# Patient Record
Sex: Female | Born: 1965 | Race: White | Hispanic: No | Marital: Married | Smoking: Never smoker
Health system: Southern US, Community
[De-identification: ages and names within clinical notes are randomized; demographics above are authoritative.]

## PROBLEM LIST (undated history)

## (undated) DIAGNOSIS — E282 Polycystic ovarian syndrome: Secondary | ICD-10-CM

## (undated) HISTORY — PX: REDUCTION MAMMAPLASTY: SUR839

## (undated) HISTORY — DX: Polycystic ovarian syndrome: E28.2

## (undated) HISTORY — PX: APPENDECTOMY: SHX54

---

## 2006-10-26 ENCOUNTER — Ambulatory Visit (HOSPITAL_COMMUNITY): Admission: RE | Admit: 2006-10-26 | Discharge: 2006-10-26 | Payer: Self-pay | Admitting: Internal Medicine

## 2006-10-26 ENCOUNTER — Ambulatory Visit: Payer: Self-pay | Admitting: Internal Medicine

## 2009-12-10 ENCOUNTER — Ambulatory Visit (HOSPITAL_COMMUNITY): Admission: RE | Admit: 2009-12-10 | Discharge: 2009-12-10 | Payer: Self-pay | Admitting: Unknown Physician Specialty

## 2009-12-21 ENCOUNTER — Ambulatory Visit (HOSPITAL_COMMUNITY): Admission: RE | Admit: 2009-12-21 | Discharge: 2009-12-21 | Payer: Self-pay | Admitting: Specialist

## 2010-07-08 ENCOUNTER — Ambulatory Visit (HOSPITAL_COMMUNITY)
Admission: RE | Admit: 2010-07-08 | Discharge: 2010-07-08 | Payer: Self-pay | Source: Home / Self Care | Admitting: Otolaryngology

## 2010-08-01 ENCOUNTER — Ambulatory Visit (HOSPITAL_COMMUNITY)
Admission: RE | Admit: 2010-08-01 | Discharge: 2010-08-01 | Payer: Self-pay | Source: Home / Self Care | Attending: Otolaryngology | Admitting: Otolaryngology

## 2010-09-20 NOTE — Op Note (Signed)
NAME:  Sharon Kemp, Sharon Kemp                    ACCOUNT NO.:  [id number]  MEDICAL RECORD NO.:  [id number]          PATIENT TYPE:  AMB  LOCATION:  SDS                          FACILITY:  [facility]  PHYSICIAN:  David L. Annalee Genta, M.D.DATE OF BIRTH:  1965/10/22  DATE OF PROCEDURE:  08/01/2010 DATE OF DISCHARGE:  08/01/2010                              OPERATIVE REPORT   PREOPERATIVE DIAGNOSES: 1. Left nasal polyp. 2. Chronic nasal obstruction. 3. Inferior turbinate hypertrophy. 4. Status post prior septorhinoplasty.  POSTOPERATIVE DIAGNOSES: 1. Left nasal polyp. 2. Chronic nasal obstruction. 3. Inferior turbinate hypertrophy. 4. Status post prior septorhinoplasty.  INDICATIONS FOR SURGERY: 1. Left nasal polyp. 2. Chronic nasal obstruction. 3. Inferior turbinate hypertrophy. 4. Status post prior septorhinoplasty.  SURGICAL PROCEDURES: 1. Endoscopic nasal polypectomy. 2. Left sphenoidotomy with removal of tissue. 3. Bilateral inferior turbinate reduction.  SURGEON:  Kinnie Scales. Annalee Genta, MD  COMPLICATIONS:  None.  ESTIMATED BLOOD LOSS:  Approximately 50 mL.  The patient transferred from the operating room to the recovery room in stable condition.  BRIEF HISTORY:  The patient is a 45 year old white female who was referred to our office with a history of progressive symptoms of nasal airway obstruction and left-sided sinus pressure.  She had undergone previous CT scan, which showed a soft tissue mass filling the entire left posterior nasal passageway.  Findings consistent with intranasal polyp.  No significant sinus disease or infection was noted on the scan. The patient had undergone prior septorhinoplasty and also complained of bilateral nasal congestion and frequent nasal airway obstruction and it has been not gradually progressive and not responded to topical and oral steroids or antibiotics.  Given her history, examination, and physical findings, I recommended that we undertake the  above surgical procedures. The risks, benefits, and possible complications were discussed in detail with the patient and her husband, and they understood and concurred plan for surgery, which is scheduled as an outpatient under general anesthesia at [facility] OR.  PROCEDURE:  The patient was brought to the operating room on August 01, 2010, and placed in a supine position on the operating table. General endotracheal anesthesia was established without difficulty. When the patient was adequately anesthetized, she was positioned on the operating table and prepped and draped in a sterile fashion.  She was then injected with a total of 5 mL of 1% lidocaine, 1:100,000 solution epinephrine was injected in a submucosal fashion along the left lateral nasal wall, inferior turbinates, and via transoral sphenopalatine block. The patient's nose then packed with Afrin-soaked cottonoid pledgets left in place for approximately 10 minutes vasoconstriction hemostasis.  The stealth computer-assisted navigation headgear was then applied and the stealth device was calibrated, anatomic and surgical landmarks were identified and confirmed, and the stealth device was used throughout the endoscopic portion of the surgical procedure.  With the patient prepared for surgery, a 0-degree endoscopy was performed bilaterally.  On the patient's left-hand side, there was a large soft-tissue polyp filling the entire posterior aspect of the left nasal cavity and nasopharynx.  Right side was clear of any polypoid disease, infection, or significant  abnormality.  Using a straight microdebrider under direct visualization, the polypoid material was completely resected, the attachment was along the left posterior nasal septum at its junction with the sphenoid rostrum.  The polypoid mass was occluding the entire left sphenoid sinus outflow tract.  Polypoid disease was resected to the level of its mucosal  attachment and resected in its entirety.  The tissue was then sent to pathology for gross microscopic evaluation.  The left sphenoid ostium was obstructed by polypoid disease.  The natural ostium was identified using the stealth navigation tool and then the soft tissue obstruction was cleared using a straight microdebrider.  Soft tissue was completely resected from the sinus ostium and small amount of polypoid material within the sinus itself, otherwise sinus was clear of any infection or polyp.  The natural ostium was enlarged in a medial and inferior direction to create a more patent left sphenoid sinus ostium.  Bovie suction cautery set at 15 watts was used to cauterize the mucosal attachment of the polypoid of the resected polyp to reduce the risk of any significant bleeding. Remainder of nasal cavity and sinus passageways were normal and patent.  Bilateral inferior turbinate reduction was then performed with cautery set at 12 watts.  Two submucosal passes were made in each inferior turbinate.  Small turbinate incisions were created and turbinate bone was resected preserving the overlying mucosa.  The turbinates were then outfractured to create a more patent nasal cavity.  The patient's nasal cavity was then irrigated and suctioned.  An orogastric tube was passed.  Stomach contents were aspirated.  The patient was then awakened from anesthetic.  She was extubated and transferred from the operating room to the recovery room in stable condition.  No complications.  Blood loss was approximately 50 mL.          ______________________________ Kinnie Scales. Annalee Genta, M.D.     DLS/MEDQ  D:  16/05/9603  T:  08/01/2010  Job:  [id number]  Electronically Signed by Osborn Coho M.D. on 09/20/2010 04:47:58 PM

## 2010-10-14 LAB — SURGICAL PCR SCREEN: MRSA, PCR: NEGATIVE

## 2010-10-14 LAB — CBC
Hemoglobin: 12.1 g/dL (ref 12.0–15.0)
Platelets: 219 10*3/uL (ref 150–400)
RBC: 4.67 MIL/uL (ref 3.87–5.11)
WBC: 6.8 10*3/uL (ref 4.0–10.5)

## 2010-10-14 LAB — HCG, SERUM, QUALITATIVE: Preg, Serum: NEGATIVE

## 2010-10-14 LAB — BASIC METABOLIC PANEL
Calcium: 9.4 mg/dL (ref 8.4–10.5)
Chloride: 102 mEq/L (ref 96–112)
Creatinine, Ser: 0.75 mg/dL (ref 0.4–1.2)
GFR calc Af Amer: >60 mL/min (ref >60)
Sodium: 138 mEq/L (ref 135–145)

## 2010-10-21 LAB — CBC
RBC: 4.15 MIL/uL (ref 3.87–5.11)
WBC: 7.2 10*3/uL (ref 4.0–10.5)

## 2010-10-21 LAB — DIFFERENTIAL
Basophils Relative: 1 % (ref 0–1)
Lymphocytes Relative: 37 % (ref 12–46)
Lymphs Abs: 2.6 10*3/uL (ref 0.7–4.0)
Monocytes Relative: 6 % (ref 3–12)
Neutro Abs: 4 10*3/uL (ref 1.7–7.7)
Neutrophils Relative %: 56 % (ref 43–77)

## 2010-10-21 LAB — URINALYSIS, ROUTINE W REFLEX MICROSCOPIC
Glucose, UA: NEGATIVE mg/dL
Leukocytes, UA: NEGATIVE
Protein, ur: NEGATIVE mg/dL
Specific Gravity, Urine: <1.005 — ABNORMAL LOW (ref 1.005–1.030)
pH: 7 (ref 5.0–8.0)

## 2010-10-21 LAB — BASIC METABOLIC PANEL
Calcium: 9 mg/dL (ref 8.4–10.5)
Creatinine, Ser: 0.73 mg/dL (ref 0.4–1.2)
GFR calc Af Amer: >60 mL/min (ref >60)
GFR calc non Af Amer: >60 mL/min (ref >60)
Sodium: 135 mEq/L (ref 135–145)

## 2010-10-21 LAB — URINE MICROSCOPIC-ADD ON

## 2010-12-20 NOTE — Op Note (Signed)
NAME:  Sharon Kemp, Sharon Kemp                    ACCOUNT NO.:  [id number]   MEDICAL RECORD NO.:  [id number]          PATIENT TYPE:  AMB   LOCATION:  DAY                           FACILITY:  [facility]   PHYSICIAN:  Lionel December, M.D.    DATE OF BIRTH:  December 01, 1965   DATE OF PROCEDURE:  10/26/2006  DATE OF DISCHARGE:                               OPERATIVE REPORT   PROCEDURE:  Colonoscopy.   [name] is a 45 year old Caucasian female who is undergoing high-risk  screening colonoscopy.  Her maternal grandfather had colon cancer and  mother has had multiple polyps removed.   Procedure risks were reviewed with the patient, informed consent was  obtained.   MEDS FOR CONSCIOUS SEDATION:  Demerol 50 mg IV, Versed 5 mg IV in  divided dose.   FINDINGS:  Procedure performed in endoscopy suite.  The patient's vital  signs and O2 sat were monitored during procedure and remained stable.  The patient was placed left lateral position and rectal examination  performed.  No abnormality noted on external or digital exam.  Pentax  videoscope was placed in rectum and advanced under vision into sigmoid  colon and beyond.  Preparation was excellent.  Scope was passed into  cecum which was identified by appendiceal stump and ileocecal valve.  Pictures taken for the record.  Short segment of TI was also examined  and was normal.  As the scope was withdrawn colonic mucosa was carefully  examined and was normal throughout.  Rectal mucosa similarly was normal.  Scope was retroflexed to examine anorectal junction.  There was some  thickening to the mucosa of the anal canal but no other abnormalities  were noted.  Endoscope was then straightened and withdrawn.  The patient  tolerated the procedure well.   FINAL DIAGNOSIS:  Normal colonoscopy and terminal ileoscopy.   RECOMMENDATIONS:  1. She will resume her usual diet and meds.  2. Yearly Hemoccults.  3. She may consider next screening exam in 5 years.      Lionel December,  M.D.  Electronically Signed    NR/MEDQ  D:  10/26/2006  T:  10/26/2006  Job:  [id number]   cc:   Doreen Beam  Fax: [phone]

## 2011-06-17 ENCOUNTER — Ambulatory Visit (INDEPENDENT_AMBULATORY_CARE_PROVIDER_SITE_OTHER): Payer: BC Managed Care – PPO | Admitting: Sports Medicine

## 2011-06-17 ENCOUNTER — Encounter: Payer: Self-pay | Admitting: Sports Medicine

## 2011-06-17 VITALS — BP 120/70 | Ht 64.0 in | Wt 130.0 lb

## 2011-06-17 DIAGNOSIS — M722 Plantar fascial fibromatosis: Secondary | ICD-10-CM | POA: Insufficient documentation

## 2011-06-17 NOTE — Assessment & Plan Note (Signed)
Likely do to overtraining. Plan: Ice baths and stretching program along with stabilization of running length for several weeks with gradual increase in running distance. Additionally we'll use arch straps. We'll followup in 6 weeks or sooner if needed.

## 2011-06-17 NOTE — Progress Notes (Signed)
[name] is a new patient who presents to clinic today with left heel pain. She has successfully completed several half marathons and is currently training for a full marathon at [facility] in February. She begun experiencing left heel pain one month ago. She experiences pain first step of the morning and during runs.  In the interim she has switched to over-the-counter supportive insoles icing with a couple frozen water and wall stretches.  Her pain has persisted. Currently her longest run is 13 miles. She is running 20-25 miles per week currently.  PMH reviewed.  ROS as above otherwise neg Medications reviewed. Current Outpatient Prescriptions  Medication Sig Dispense Refill  . escitalopram (LEXAPRO) 10 MG tablet       . LUTERA 0.1-20 MG-MCG tablet       . zolpidem (AMBIEN) 10 MG tablet         Exam:  BP 120/70  Ht 5\' 4"  (1.626 m)  Wt 130 lb (58.968 kg)  BMI 22.31 kg/m2 Gen: Well NAD MSK: Equal leg length. Normal-appearing feet bilaterally with neutral arches. Gait normal. Pain on palpation of left heel.  MSK [place]: Left plantar fascia shows edema on the bone surface in a thickness of 0.6 cm along the insertion.  Unaffected size plantar fascia appears normal and measured at 0.4 cm.

## 2011-06-17 NOTE — Patient Instructions (Signed)
Thank you for coming in today. Do the stretches and exercises in the handout 3 x a day if possible.  Keep your mileage stable for 2-3 weeks then gradually increase.  Use a pan of ice water. Stick your heel in the water for 10-15 mins 1-2 x a day.  Return in 6 weeks or sooner if needed.

## 2011-07-23 ENCOUNTER — Ambulatory Visit: Payer: BC Managed Care – PPO | Admitting: Sports Medicine

## 2011-08-07 ENCOUNTER — Ambulatory Visit: Payer: BC Managed Care – PPO | Admitting: Sports Medicine

## 2011-08-19 ENCOUNTER — Ambulatory Visit (INDEPENDENT_AMBULATORY_CARE_PROVIDER_SITE_OTHER): Payer: BC Managed Care – PPO | Admitting: Sports Medicine

## 2011-08-19 VITALS — BP 102/64

## 2011-08-19 DIAGNOSIS — M722 Plantar fascial fibromatosis: Secondary | ICD-10-CM

## 2011-08-19 NOTE — Progress Notes (Signed)
  Subjective:    Patient ID: Sharon Kemp, female    DOB: 02-24-1966, 46 y.o.   MRN: [id number]  HPI  Pt presents to clinic for f/u of L plantar fasciitis which she reports is 60% improved. She is able to run 35 mpw now without significant pain.  Does stretches before getting out of bed, but still has some pain with first step in the morning. Compliant with stretches and exercises She is scheduled to run a marathon Feb 16.    She was able to complete her 18 mile run this past week without significant problems or worsening    Review of Systems     Objective:   Physical Exam No acute distress Leg lengths equal Good alignment Hip abduction, rotation, and flexion strength good on lt  Foot structures essentially normal Moderate arch Good great toe motion Tenderness to palpation at the insertion of the left plantar fascia to the medial calcaneus  Running gait was normal and I did not see evidence of limp       Assessment & Plan:

## 2011-08-19 NOTE — Patient Instructions (Addendum)
Continue doing plantar fasciitis exercises and stretches through the marathon and for a couple of months afterwards  Use arch straps as needed.  Please follow up as needed.

## 2011-08-19 NOTE — Assessment & Plan Note (Signed)
Continue all of her current therapies through the marathon.  Spenco over-the-counter inserts seem to be working well.  At the end of the marathon she should cut back moderately for a couple months.  Keep up exercises and stretches  If pain has not resolved at that time she should return to see [place].

## 2011-09-30 ENCOUNTER — Encounter (INDEPENDENT_AMBULATORY_CARE_PROVIDER_SITE_OTHER): Payer: Self-pay | Admitting: *Deleted

## 2012-03-23 ENCOUNTER — Emergency Department (HOSPITAL_COMMUNITY)
Admission: EM | Admit: 2012-03-23 | Discharge: 2012-03-23 | Disposition: A | Discharge status: Home / Self Care | Payer: BC Managed Care – PPO | Attending: Emergency Medicine | Admitting: Emergency Medicine

## 2012-03-23 ENCOUNTER — Encounter (HOSPITAL_COMMUNITY): Payer: Self-pay | Admitting: *Deleted

## 2012-03-23 DIAGNOSIS — W268XXA Contact with other sharp object(s), not elsewhere classified, initial encounter: Secondary | ICD-10-CM | POA: Insufficient documentation

## 2012-03-23 DIAGNOSIS — E282 Polycystic ovarian syndrome: Secondary | ICD-10-CM | POA: Insufficient documentation

## 2012-03-23 DIAGNOSIS — S61209A Unspecified open wound of unspecified finger without damage to nail, initial encounter: Secondary | ICD-10-CM | POA: Insufficient documentation

## 2012-03-23 DIAGNOSIS — S61219A Laceration without foreign body of unspecified finger without damage to nail, initial encounter: Secondary | ICD-10-CM

## 2012-03-23 MED ORDER — LIDOCAINE HCL (PF) 1 % IJ SOLN
INTRAMUSCULAR | Status: AC
  Administered 2012-03-23: 19:00:00
  Filled 2012-03-23: qty 5

## 2012-03-23 MED ORDER — BACITRACIN ZINC 500 UNIT/GM EX OINT
TOPICAL_OINTMENT | CUTANEOUS | Status: AC
  Administered 2012-03-23: 1 via TOPICAL
  Filled 2012-03-23: qty 0.9

## 2012-03-23 MED ORDER — BACITRACIN 500 UNIT/GM EX OINT
1.0000 "application " | TOPICAL_OINTMENT | Freq: Two times a day (BID) | CUTANEOUS | Status: DC
  Administered 2012-03-23: 1 via TOPICAL
  Filled 2012-03-23 (×4): qty 0.9

## 2012-03-23 NOTE — ED Notes (Signed)
Lac to rt hand, cut on can lid. At base of index finger , Bleeding controlled.

## 2012-03-23 NOTE — ED Provider Notes (Signed)
History     CSN: [id number]  Arrival date & time 03/23/12  4098   First MD Initiated Contact with Patient 03/23/12 1850      Chief Complaint  Patient presents with  . Laceration    (Consider location/radiation/quality/duration/timing/severity/associated sxs/prior treatment) Patient is a 46 y.o. female presenting with skin laceration. The history is provided by the patient and the spouse.  Laceration  The incident occurred 1 to 2 hours ago. The laceration is located on the right hand. The laceration is 3 cm in size. The laceration mechanism was a a metal edge (metal can lid). The pain is mild. The pain has been constant since onset. She reports no foreign bodies present. Her tetanus status is UTD.    Past Medical History  Diagnosis Date  . PCOS (polycystic ovarian syndrome)     Past Surgical History  Procedure Date  . Appendectomy     History reviewed. No pertinent family history.  History  Substance Use Topics  . Smoking status: Never Smoker   . Smokeless tobacco: Never Used  . Alcohol Use: No    OB History    Grav Para Term Preterm Abortions TAB SAB Ect Mult Living                  Review of Systems  Constitutional: Negative for fever.  Musculoskeletal: Negative for joint swelling and arthralgias.  Skin: Positive for wound.       Laceration right hand  Neurological: Negative for weakness, numbness and headaches.  Hematological: Negative for adenopathy. Does not bruise/bleed easily.  All other systems reviewed and are negative.    Allergies  Review of patient's allergies indicates no known allergies.  Home Medications   Current Outpatient Rx  Name Route Sig Dispense Refill  . ESCITALOPRAM OXALATE 10 MG PO TABS      . LUTERA 0.1-20 MG-MCG PO TABS      . ZOLPIDEM TARTRATE 10 MG PO TABS        BP 110/68  Pulse 54  Temp 98.4 F (36.9 C) (Oral)  Resp 18  Ht 5\' 4"  (1.626 m)  Wt 130 lb (58.968 kg)  BMI 22.31 kg/m2  SpO2 100%  LMP  02/26/2012  Physical Exam  Nursing note and vitals reviewed. Constitutional: She is oriented to person, place, and time. She appears well-developed and well-nourished. No distress.  HENT:  Head: Normocephalic and atraumatic.  Cardiovascular: Normal rate, regular rhythm and normal heart sounds.   Pulmonary/Chest: Effort normal and breath sounds normal.  Musculoskeletal: She exhibits no edema and no tenderness.       Right hand: She exhibits laceration. She exhibits normal range of motion, no tenderness, no bony tenderness, normal two-point discrimination, normal capillary refill, no deformity and no swelling. normal sensation noted. Normal strength noted.       Hands:      superficial laceration to the proximal right index finger.  Bleeding controlled.  No muscle , nerve or tendon injury seen.  Pt has full ROM of the finger, radial pulse brisk, sensation intact, CR< 2 sec  Neurological: She is alert and oriented to person, place, and time. She exhibits normal muscle tone. Coordination normal.  Skin: Laceration noted.       Laceration to the proximal right index finger    ED Course  Procedures (including critical care time)  Labs Reviewed - No data to display      MDM      LACERATION REPAIR Performed by: Glanda Spanbauer L. Authorized  by: Jabree Rebert L. Consent: Verbal consent obtained. Risks and benefits: risks, benefits and alternatives were discussed Consent given by: patient Patient identity confirmed: provided demographic data Prepped and Draped in normal sterile fashion Wound explored  Laceration Location: proximal right index finger Laceration Length: 3cm  No Foreign Bodies seen or palpated  Anesthesia: local infiltration  Local anesthetic: lidocaine1% w/o epinephrine  Anesthetic total: 2 ml  Irrigation method: syringe Amount of cleaning: standard  Skin closure: 4-0 prolene Number of sutures: 4 Technique: simple interrupted    Patient tolerance:  Patient tolerated the procedure well with no immediate complications.   Wound(s) explored with adequate hemostasis through ROM, no apparent gross foreign body retained, no significant involvement of deep structures such as bone / joint / tendon / or neurovascular involvement noted.  Baseline Strength and Sensation to affected extremity(ies) with normal light touch for Pt, distal NVI with CR< 2 secs and pulse(s) intact to affected extremity(ies).   The patient appears reasonably screened and/or stabilized for discharge and I doubt any other medical condition or other [facility] requiring further screening, evaluation, or treatment in the ED at this time prior to discharge.   Pt agrees to return to ER for any signs of infection.  Prefers to take ibuprofen if needed for pain  Ryen Heitmeyer L. [place], [place] 03/25/12 1851

## 2012-03-26 NOTE — ED Provider Notes (Signed)
Medical screening examination/treatment/procedure(s) were performed by non-physician practitioner and as supervising physician I was immediately available for consultation/collaboration.  Zubin Pontillo, MD 03/26/12 0705 

## 2014-06-22 ENCOUNTER — Other Ambulatory Visit: Payer: Self-pay | Admitting: Obstetrics and Gynecology

## 2014-06-27 LAB — CYTOLOGY - PAP

## 2016-02-13 DIAGNOSIS — Z01419 Encounter for gynecological examination (general) (routine) without abnormal findings: Secondary | ICD-10-CM | POA: Diagnosis not present

## 2016-02-13 DIAGNOSIS — Z6821 Body mass index (BMI) 21.0-21.9, adult: Secondary | ICD-10-CM | POA: Diagnosis not present

## 2016-02-13 DIAGNOSIS — Z1231 Encounter for screening mammogram for malignant neoplasm of breast: Secondary | ICD-10-CM | POA: Diagnosis not present

## 2016-02-15 ENCOUNTER — Other Ambulatory Visit: Payer: Self-pay | Admitting: Obstetrics and Gynecology

## 2016-02-15 DIAGNOSIS — R928 Other abnormal and inconclusive findings on diagnostic imaging of breast: Secondary | ICD-10-CM

## 2016-02-21 ENCOUNTER — Ambulatory Visit
Admission: RE | Admit: 2016-02-21 | Discharge: 2016-02-21 | Disposition: A | Discharge status: Home / Self Care | Payer: BLUE CROSS/BLUE SHIELD | Source: Ambulatory Visit | Attending: Obstetrics and Gynecology | Admitting: Obstetrics and Gynecology

## 2016-02-21 DIAGNOSIS — N6489 Other specified disorders of breast: Secondary | ICD-10-CM | POA: Diagnosis not present

## 2016-02-21 DIAGNOSIS — R928 Other abnormal and inconclusive findings on diagnostic imaging of breast: Secondary | ICD-10-CM

## 2016-02-21 DIAGNOSIS — R922 Inconclusive mammogram: Secondary | ICD-10-CM | POA: Diagnosis not present

## 2016-05-13 DIAGNOSIS — H01131 Eczematous dermatitis of right upper eyelid: Secondary | ICD-10-CM | POA: Diagnosis not present

## 2016-05-27 DIAGNOSIS — Z0000 Encounter for general adult medical examination without abnormal findings: Secondary | ICD-10-CM | POA: Diagnosis not present

## 2016-05-27 DIAGNOSIS — R739 Hyperglycemia, unspecified: Secondary | ICD-10-CM | POA: Diagnosis not present

## 2016-05-27 DIAGNOSIS — E559 Vitamin D deficiency, unspecified: Secondary | ICD-10-CM | POA: Diagnosis not present

## 2016-05-27 DIAGNOSIS — Z23 Encounter for immunization: Secondary | ICD-10-CM | POA: Diagnosis not present

## 2016-05-27 DIAGNOSIS — Z13220 Encounter for screening for lipoid disorders: Secondary | ICD-10-CM | POA: Diagnosis not present

## 2016-08-25 DIAGNOSIS — F422 Mixed obsessional thoughts and acts: Secondary | ICD-10-CM | POA: Diagnosis not present

## 2016-09-01 DIAGNOSIS — D649 Anemia, unspecified: Secondary | ICD-10-CM | POA: Diagnosis not present

## 2016-09-02 DIAGNOSIS — D649 Anemia, unspecified: Secondary | ICD-10-CM | POA: Diagnosis not present

## 2016-09-03 ENCOUNTER — Encounter: Payer: Self-pay | Admitting: Internal Medicine

## 2016-09-03 DIAGNOSIS — D509 Iron deficiency anemia, unspecified: Secondary | ICD-10-CM | POA: Diagnosis not present

## 2016-09-03 DIAGNOSIS — E282 Polycystic ovarian syndrome: Secondary | ICD-10-CM | POA: Diagnosis not present

## 2016-09-03 DIAGNOSIS — K5900 Constipation, unspecified: Secondary | ICD-10-CM | POA: Diagnosis not present

## 2016-09-03 DIAGNOSIS — Z6822 Body mass index (BMI) 22.0-22.9, adult: Secondary | ICD-10-CM | POA: Diagnosis not present

## 2016-11-24 DIAGNOSIS — D649 Anemia, unspecified: Secondary | ICD-10-CM | POA: Diagnosis not present

## 2016-11-26 DIAGNOSIS — Z23 Encounter for immunization: Secondary | ICD-10-CM | POA: Diagnosis not present

## 2016-11-26 DIAGNOSIS — Z6822 Body mass index (BMI) 22.0-22.9, adult: Secondary | ICD-10-CM | POA: Diagnosis not present

## 2016-11-26 DIAGNOSIS — K5900 Constipation, unspecified: Secondary | ICD-10-CM | POA: Diagnosis not present

## 2016-11-26 DIAGNOSIS — D509 Iron deficiency anemia, unspecified: Secondary | ICD-10-CM | POA: Diagnosis not present

## 2016-12-12 ENCOUNTER — Other Ambulatory Visit: Payer: Self-pay | Admitting: Obstetrics and Gynecology

## 2016-12-12 DIAGNOSIS — N6489 Other specified disorders of breast: Secondary | ICD-10-CM

## 2017-02-09 DIAGNOSIS — Z23 Encounter for immunization: Secondary | ICD-10-CM | POA: Diagnosis not present

## 2017-02-13 ENCOUNTER — Ambulatory Visit: Payer: BLUE CROSS/BLUE SHIELD

## 2017-02-13 ENCOUNTER — Ambulatory Visit
Admission: RE | Admit: 2017-02-13 | Discharge: 2017-02-13 | Disposition: A | Discharge status: Home / Self Care | Payer: BLUE CROSS/BLUE SHIELD | Source: Ambulatory Visit | Attending: Obstetrics and Gynecology | Admitting: Obstetrics and Gynecology

## 2017-02-13 DIAGNOSIS — R922 Inconclusive mammogram: Secondary | ICD-10-CM | POA: Diagnosis not present

## 2017-02-13 DIAGNOSIS — N6489 Other specified disorders of breast: Secondary | ICD-10-CM

## 2017-04-19 IMAGING — MG 2D DIGITAL DIAGNOSTIC UNILATERAL RIGHT MAMMOGRAM WITH CAD AND AD
6 of 9 series · 6 of 21 positions shown · non-contrast
Comparison: Previous exam(s).

CLINICAL DATA: Callback from screening mammogram. The patient
reports having had interval bilateral breast reductions in 1181.

EXAM:
2D DIGITAL DIAGNOSTIC RIGHT MAMMOGRAM WITH CAD AND ADJUNCT TOMO
ULTRASOUND RIGHT BREAST

[R MLO synth-2D]
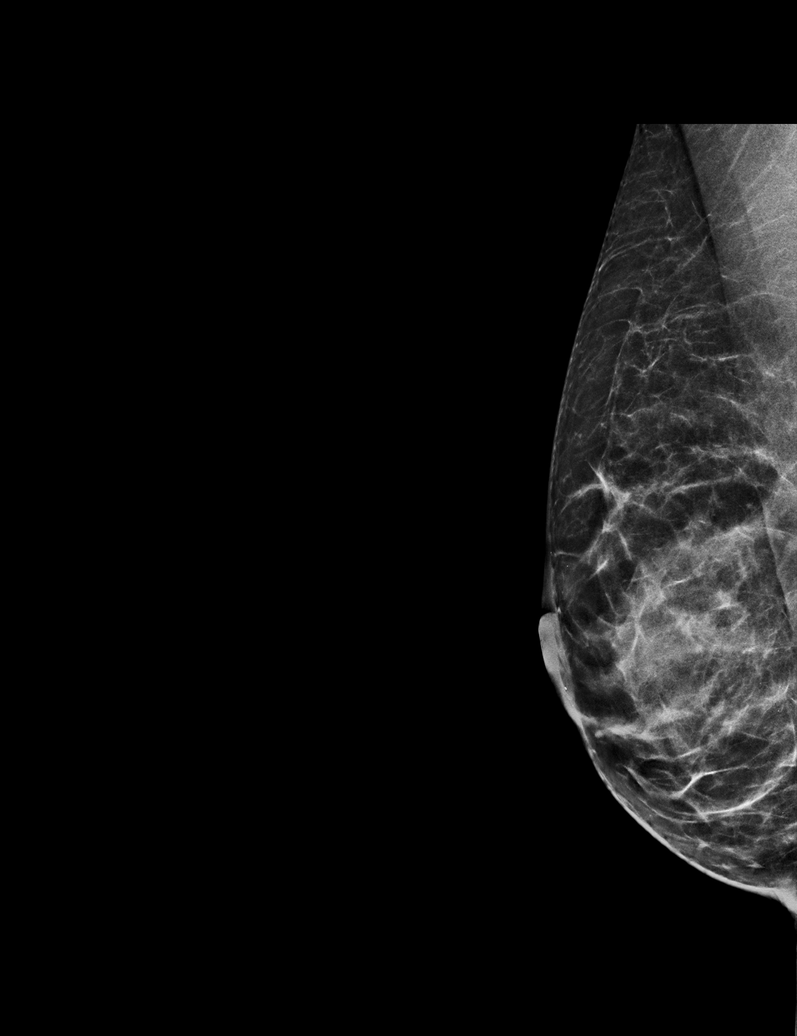

[R CC synth-2D (1 of 2)]
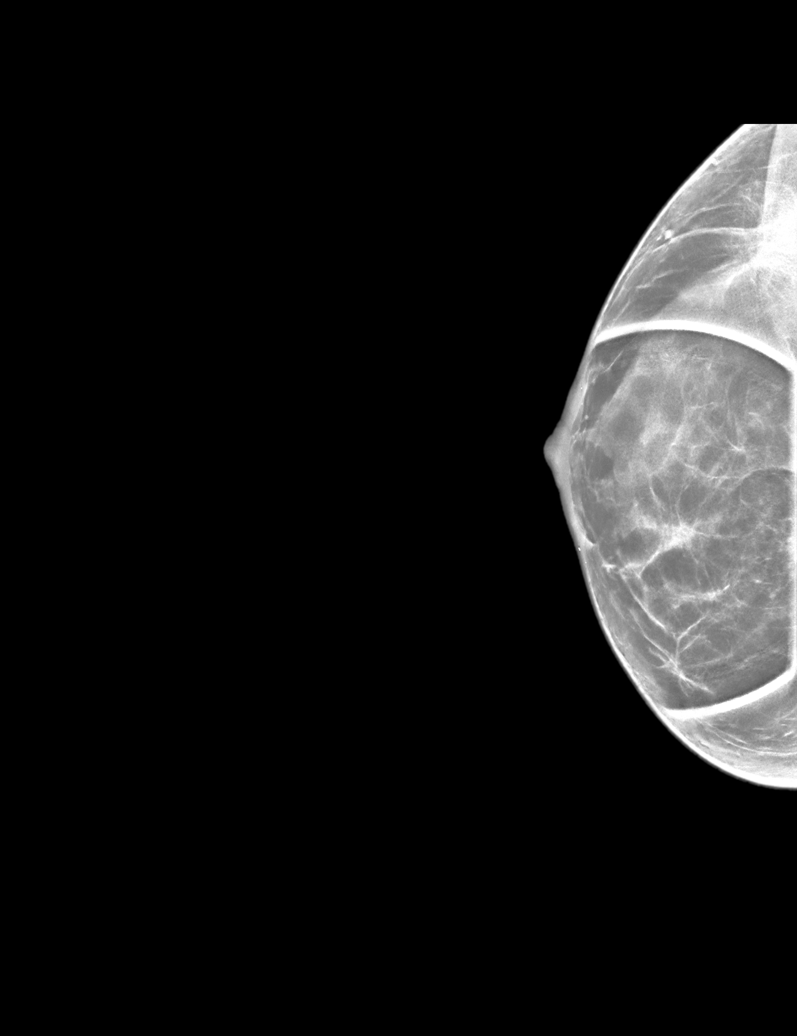

[R CC synth-2D (2 of 2)]
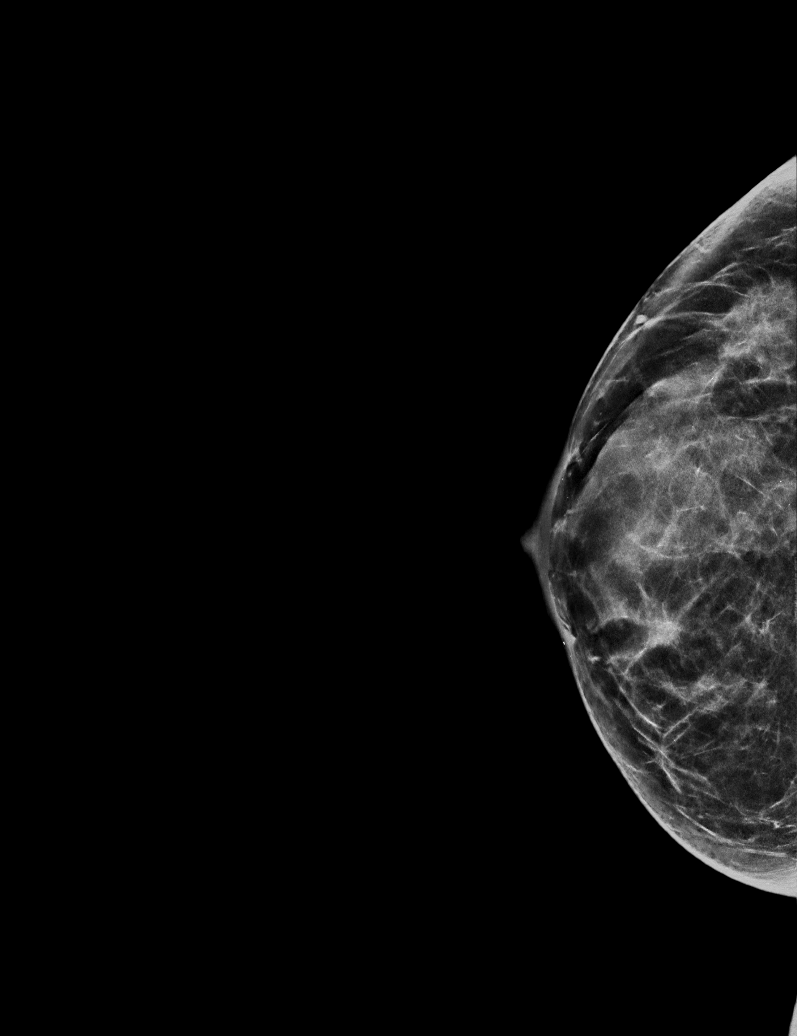

[R CC (1 of 2)]
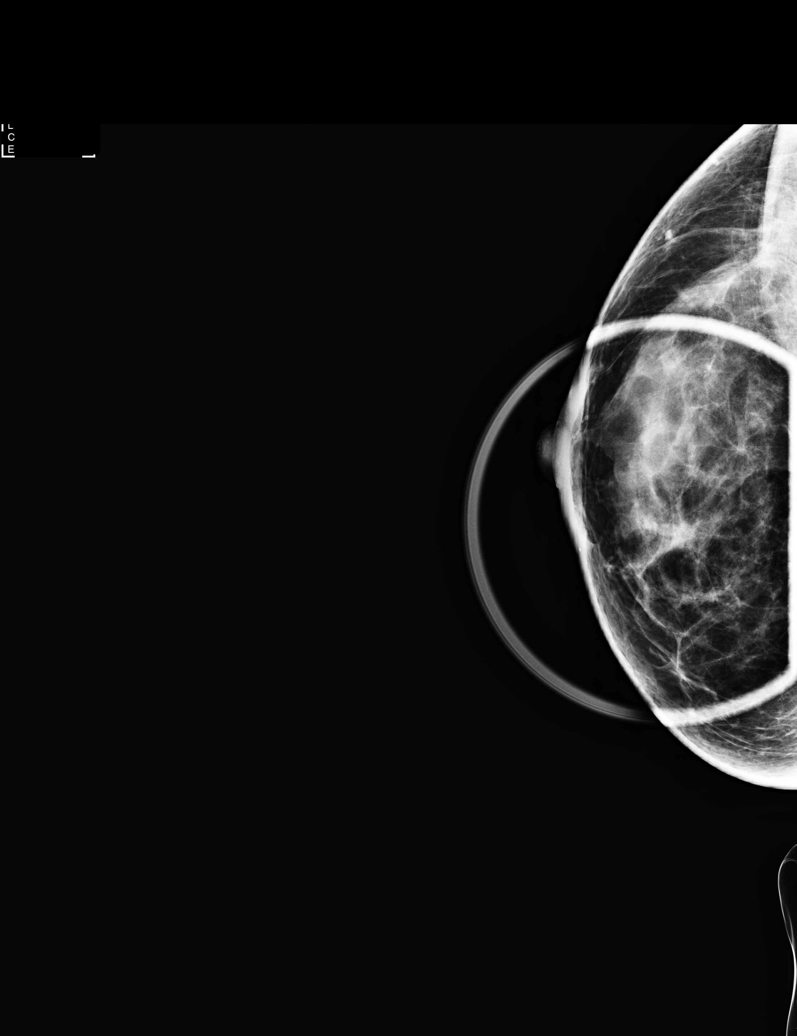

[R MLO]
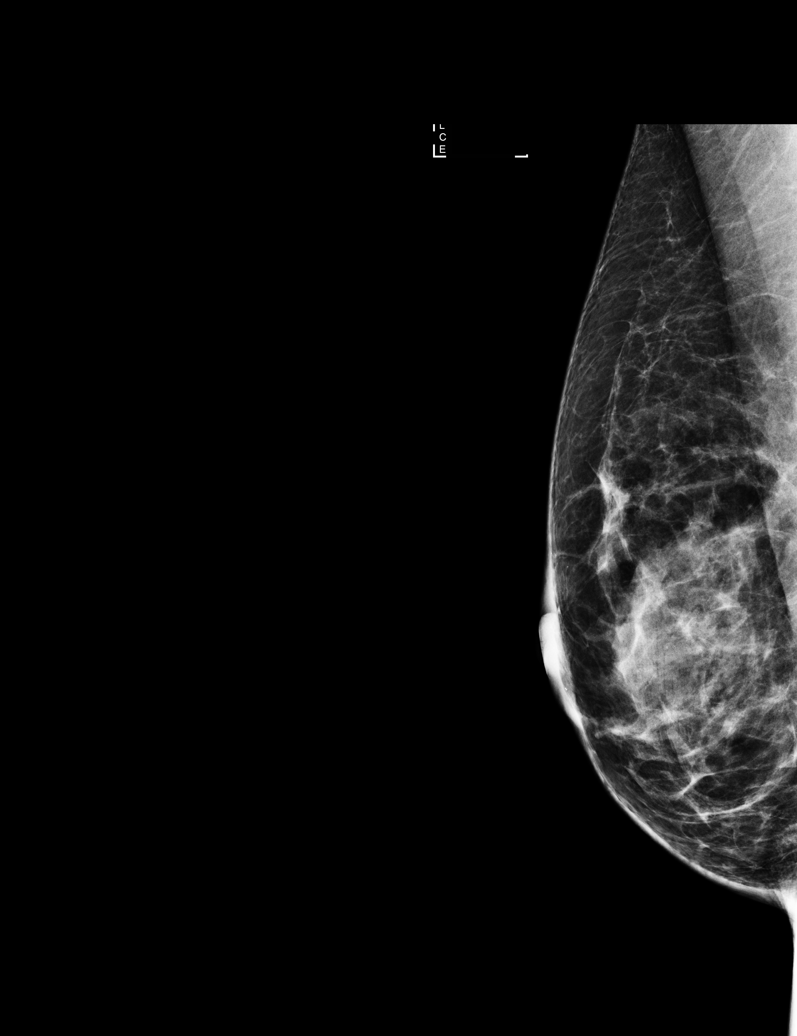

[R CC (2 of 2)]
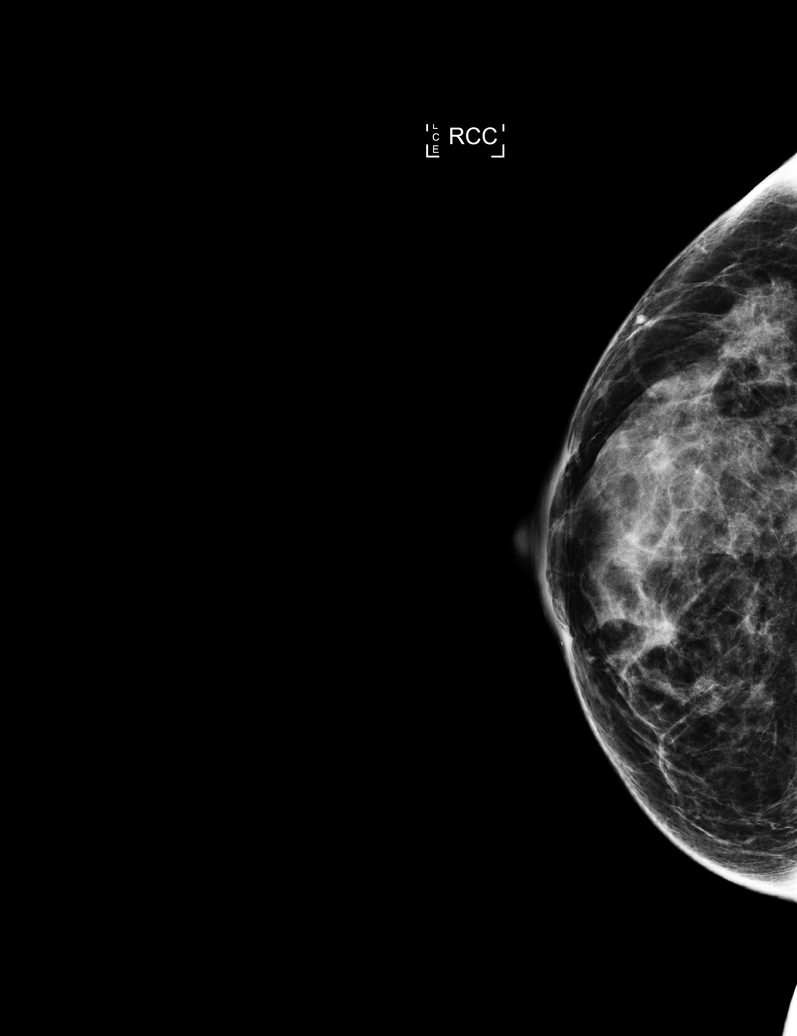

[6 of 21 positions shown; findings below may reference images not displayed]

ACR Breast Density Category c: The breast tissue is heterogeneously
dense, which may obscure small masses.
FINDINGS: Cc, MLO, and CC spot compression views of the right breast were
performed with tomosynthesis. On the additional views, the possible
asymmetry within the upper, inner breast persists but is less
prominent. The breast parenchymal pattern in this area appears
similar to mammogram from 9494. Tomosynthesis views demonstrate no
suspicious mass.

Mammographic images were processed with CAD.

On physical exam, the patient has keyhole scarring of the right
breast consistent with prior breast reduction.

Targeted ultrasound is performed, showing no suspicious cystic or
solid sonographic finding within the area of concern in the medial
right breast.
IMPRESSION: Probably benign right breast asymmetry.

RECOMMENDATION:
Right diagnostic mammogram and possible ultrasound in 6 months.

I have discussed the findings and recommendations with the patient.
Results were also provided in writing at the conclusion of the
visit. If applicable, a reminder letter will be sent to the patient
regarding the next appointment.

BI-RADS CATEGORY  3: Probably benign.

## 2017-05-27 DIAGNOSIS — Z0000 Encounter for general adult medical examination without abnormal findings: Secondary | ICD-10-CM | POA: Diagnosis not present

## 2017-05-27 DIAGNOSIS — Z13220 Encounter for screening for lipoid disorders: Secondary | ICD-10-CM | POA: Diagnosis not present

## 2017-05-27 DIAGNOSIS — Z114 Encounter for screening for human immunodeficiency virus [HIV]: Secondary | ICD-10-CM | POA: Diagnosis not present

## 2017-05-27 DIAGNOSIS — E559 Vitamin D deficiency, unspecified: Secondary | ICD-10-CM | POA: Diagnosis not present

## 2017-05-27 DIAGNOSIS — Z1329 Encounter for screening for other suspected endocrine disorder: Secondary | ICD-10-CM | POA: Diagnosis not present

## 2017-05-28 DIAGNOSIS — Z01419 Encounter for gynecological examination (general) (routine) without abnormal findings: Secondary | ICD-10-CM | POA: Diagnosis not present

## 2017-05-28 DIAGNOSIS — Z6821 Body mass index (BMI) 21.0-21.9, adult: Secondary | ICD-10-CM | POA: Diagnosis not present

## 2017-05-29 DIAGNOSIS — Z0000 Encounter for general adult medical examination without abnormal findings: Secondary | ICD-10-CM | POA: Diagnosis not present

## 2017-05-29 DIAGNOSIS — Z6820 Body mass index (BMI) 20.0-20.9, adult: Secondary | ICD-10-CM | POA: Diagnosis not present

## 2017-05-29 DIAGNOSIS — Z1211 Encounter for screening for malignant neoplasm of colon: Secondary | ICD-10-CM | POA: Diagnosis not present

## 2017-05-29 DIAGNOSIS — Z23 Encounter for immunization: Secondary | ICD-10-CM | POA: Diagnosis not present

## 2017-09-29 DIAGNOSIS — F422 Mixed obsessional thoughts and acts: Secondary | ICD-10-CM | POA: Diagnosis not present

## 2017-11-25 DIAGNOSIS — M4317 Spondylolisthesis, lumbosacral region: Secondary | ICD-10-CM | POA: Diagnosis not present

## 2017-12-03 ENCOUNTER — Ambulatory Visit (INDEPENDENT_AMBULATORY_CARE_PROVIDER_SITE_OTHER): Payer: Self-pay | Admitting: Orthopaedic Surgery

## 2017-12-03 ENCOUNTER — Encounter (INDEPENDENT_AMBULATORY_CARE_PROVIDER_SITE_OTHER): Payer: Self-pay | Admitting: Orthopaedic Surgery

## 2017-12-03 ENCOUNTER — Ambulatory Visit (INDEPENDENT_AMBULATORY_CARE_PROVIDER_SITE_OTHER): Payer: Self-pay

## 2017-12-03 VITALS — BP 114/74 | HR 67 | Ht 64.0 in | Wt 119.0 lb

## 2017-12-03 DIAGNOSIS — M545 Low back pain: Secondary | ICD-10-CM

## 2017-12-03 DIAGNOSIS — G8929 Other chronic pain: Secondary | ICD-10-CM

## 2017-12-03 NOTE — Progress Notes (Signed)
Office Visit Note   Patient: Sharon Kemp           Date of Birth: 12-23-1965           MRN: [id number] Visit Date: 12/03/2017              Requested by: Ignatius Specking, MD [address] [place], [place] [address] PCP: Ignatius Specking, MD   Assessment & Plan: Visit Diagnoses:  1. Chronic right-sided low back pain, with sciatica presence unspecified     Plan: Patient's had 2 previous MRI scans which showed some disc dehydration at L4-5 and L5-S1 with minimal disc protrusion L5-S1 and grade 1 anterolisthesis.  Neurologically intact has some mild nerve root tension signs side without weakness.  I would recommend continue anti-inflammatories gradually resume cardiovascular work using a bike gradually walk until she is able to ambulate without discomfort and then gradually began slight walking and occasional jogging and then do a very very slow gradual ramp up back to her normal activities over many weeks.  This point I do not think she needs repeat imaging.  If she gets increased pain she can let us know return for repeat exam.  With her fall down the stairs she probably flared up her lumbar symptoms but this should gradually resolve with time.  Follow-Up Instructions: Return if symptoms worsen or fail to improve.   Orders:  Orders Placed This Encounter  Procedures  . XR Lumbar Spine 2-3 Views  . XR Pelvis 1-2 Views   No orders of the defined types were placed in this encounter.     Procedures: No procedures performed   Clinical Data: No additional findings.   Subjective: Chief Complaint  Patient presents with  . Lower Back - Pain  . Right Hip - Pain    HPI 52 year old female who is a runner fell down the stairs 6 weeks ago with persistent problems with right lateral hip pain.  She has known grade 1 L5-S1 spondylolisthesis is had persistent pain in her right buttocks preventing her from running.  Concerned she might of injured her hip.  She has children and her husband is a Optometrist.   Been able to ambulate and walk and states she has some aching pain on the right side.  She denies associated bowel or bladder. symptoms. Review of Systems 14 point review of systems positive for previous appendectomy 91.  Nasal polyps 2011.  Childbirth without problems.  Occasional problems sleeping.  She takes calcium and vitamin D.  L5-S1 spondylolisthesis.   Objective: Vital Signs: BP 114/74   Pulse 67   Ht  (1.626 m)   Wt 119 lb (54 kg)   BMI 20.43 kg/m   Physical Exam  Constitutional: She is oriented to person, place, and time. She appears well-developed.  HENT:  Head: Normocephalic.  Right Ear: External ear normal.  Left Ear: External ear normal.  Eyes: Pupils are equal, round, and reactive to light.  Neck: No tracheal deviation present. No thyromegaly present.  Cardiovascular: Normal rate.  Pulmonary/Chest: Effort normal.  Abdominal: Soft.  Neurological: She is alert and oriented to person, place, and time.  Skin: Skin is warm and dry.  Psychiatric: She has a normal mood and affect. Her behavior is normal.    Ortho Exam patient has some discomfort with straight leg raising at 90 degrees on the right side.  Mild sciatic notch tenderness minimal tenderness of the trochanteric bursa gluteus medius.  No hip weakness.  Hip flexors  are strong.  Mild discomfort with Pearlean Brownie test supine position.  Toe walk.  Good calf quad strength , mild left lumbar curvature sensory testing the feet are normal.  Specialty Comments:  No specialty comments available.  Imaging: No results found.   PMFS History: Patient Active Problem List   Diagnosis Date Noted  . Plantar fasciitis 06/17/2011   Past Medical History:  Diagnosis Date  . PCOS (polycystic ovarian syndrome)     No family history on file.  Past Surgical History:  Procedure Laterality Date  . APPENDECTOMY    . REDUCTION MAMMAPLASTY Bilateral    Social History   Occupational History  . Not on file  Tobacco Use  .  Smoking status: Never Smoker  . Smokeless tobacco: Never Used  Substance and Sexual Activity  . Alcohol use: No  . Drug use: No  . Sexual activity: Yes    Birth control/protection: Pill

## 2017-12-10 DIAGNOSIS — M4317 Spondylolisthesis, lumbosacral region: Secondary | ICD-10-CM | POA: Diagnosis not present

## 2017-12-10 DIAGNOSIS — M5416 Radiculopathy, lumbar region: Secondary | ICD-10-CM | POA: Diagnosis not present

## 2017-12-23 DIAGNOSIS — M5416 Radiculopathy, lumbar region: Secondary | ICD-10-CM | POA: Diagnosis not present

## 2018-05-28 DIAGNOSIS — Z0000 Encounter for general adult medical examination without abnormal findings: Secondary | ICD-10-CM | POA: Diagnosis not present

## 2018-05-28 DIAGNOSIS — Z114 Encounter for screening for human immunodeficiency virus [HIV]: Secondary | ICD-10-CM | POA: Diagnosis not present

## 2018-05-28 DIAGNOSIS — Z1329 Encounter for screening for other suspected endocrine disorder: Secondary | ICD-10-CM | POA: Diagnosis not present

## 2018-05-28 DIAGNOSIS — Z13220 Encounter for screening for lipoid disorders: Secondary | ICD-10-CM | POA: Diagnosis not present

## 2018-06-02 DIAGNOSIS — N393 Stress incontinence (female) (male): Secondary | ICD-10-CM | POA: Diagnosis not present

## 2018-06-02 DIAGNOSIS — D509 Iron deficiency anemia, unspecified: Secondary | ICD-10-CM | POA: Diagnosis not present

## 2018-06-02 DIAGNOSIS — Z6820 Body mass index (BMI) 20.0-20.9, adult: Secondary | ICD-10-CM | POA: Diagnosis not present

## 2018-06-02 DIAGNOSIS — Z0000 Encounter for general adult medical examination without abnormal findings: Secondary | ICD-10-CM | POA: Diagnosis not present

## 2018-06-02 DIAGNOSIS — K5900 Constipation, unspecified: Secondary | ICD-10-CM | POA: Diagnosis not present

## 2018-06-16 DIAGNOSIS — Z01419 Encounter for gynecological examination (general) (routine) without abnormal findings: Secondary | ICD-10-CM | POA: Diagnosis not present

## 2018-06-16 DIAGNOSIS — Z1231 Encounter for screening mammogram for malignant neoplasm of breast: Secondary | ICD-10-CM | POA: Diagnosis not present

## 2018-06-16 DIAGNOSIS — Z6820 Body mass index (BMI) 20.0-20.9, adult: Secondary | ICD-10-CM | POA: Diagnosis not present

## 2018-07-09 DIAGNOSIS — M940 Chondrocostal junction syndrome [Tietze]: Secondary | ICD-10-CM | POA: Diagnosis not present

## 2018-07-21 DIAGNOSIS — M5416 Radiculopathy, lumbar region: Secondary | ICD-10-CM | POA: Diagnosis not present

## 2018-09-23 DIAGNOSIS — F422 Mixed obsessional thoughts and acts: Secondary | ICD-10-CM | POA: Diagnosis not present

## 2019-08-09 DIAGNOSIS — Z1151 Encounter for screening for human papillomavirus (HPV): Secondary | ICD-10-CM | POA: Diagnosis not present

## 2019-08-09 DIAGNOSIS — Z6821 Body mass index (BMI) 21.0-21.9, adult: Secondary | ICD-10-CM | POA: Diagnosis not present

## 2019-08-09 DIAGNOSIS — Z1231 Encounter for screening mammogram for malignant neoplasm of breast: Secondary | ICD-10-CM | POA: Diagnosis not present

## 2019-08-09 DIAGNOSIS — Z01419 Encounter for gynecological examination (general) (routine) without abnormal findings: Secondary | ICD-10-CM | POA: Diagnosis not present

## 2019-08-11 ENCOUNTER — Other Ambulatory Visit: Payer: Self-pay | Admitting: Obstetrics and Gynecology

## 2019-08-11 DIAGNOSIS — R928 Other abnormal and inconclusive findings on diagnostic imaging of breast: Secondary | ICD-10-CM

## 2019-08-18 ENCOUNTER — Ambulatory Visit: Payer: BLUE CROSS/BLUE SHIELD

## 2019-08-18 ENCOUNTER — Other Ambulatory Visit: Payer: Self-pay

## 2019-08-18 ENCOUNTER — Ambulatory Visit
Admission: RE | Admit: 2019-08-18 | Discharge: 2019-08-18 | Disposition: A | Discharge status: Home / Self Care | Payer: BC Managed Care – PPO | Source: Ambulatory Visit | Attending: Obstetrics and Gynecology | Admitting: Obstetrics and Gynecology

## 2019-08-18 DIAGNOSIS — R922 Inconclusive mammogram: Secondary | ICD-10-CM | POA: Diagnosis not present

## 2019-08-18 DIAGNOSIS — R928 Other abnormal and inconclusive findings on diagnostic imaging of breast: Secondary | ICD-10-CM

## 2019-09-15 DIAGNOSIS — F422 Mixed obsessional thoughts and acts: Secondary | ICD-10-CM | POA: Diagnosis not present

## 2019-10-20 DIAGNOSIS — Z23 Encounter for immunization: Secondary | ICD-10-CM | POA: Diagnosis not present

## 2019-11-16 DIAGNOSIS — Z23 Encounter for immunization: Secondary | ICD-10-CM | POA: Diagnosis not present

## 2020-03-05 DIAGNOSIS — Z23 Encounter for immunization: Secondary | ICD-10-CM | POA: Diagnosis not present

## 2020-03-05 DIAGNOSIS — S61011A Laceration without foreign body of right thumb without damage to nail, initial encounter: Secondary | ICD-10-CM | POA: Diagnosis not present

## 2020-03-05 DIAGNOSIS — Y9301 Activity, walking, marching and hiking: Secondary | ICD-10-CM | POA: Diagnosis not present

## 2020-03-05 DIAGNOSIS — M79641 Pain in right hand: Secondary | ICD-10-CM | POA: Diagnosis not present

## 2020-03-05 DIAGNOSIS — W458XXA Other foreign body or object entering through skin, initial encounter: Secondary | ICD-10-CM | POA: Diagnosis not present

## 2020-03-05 DIAGNOSIS — S61411A Laceration without foreign body of right hand, initial encounter: Secondary | ICD-10-CM | POA: Diagnosis not present

## 2020-03-05 DIAGNOSIS — S6991XA Unspecified injury of right wrist, hand and finger(s), initial encounter: Secondary | ICD-10-CM | POA: Diagnosis not present

## 2020-07-06 ENCOUNTER — Ambulatory Visit (INDEPENDENT_AMBULATORY_CARE_PROVIDER_SITE_OTHER): Payer: BC Managed Care – PPO

## 2020-07-06 ENCOUNTER — Other Ambulatory Visit: Payer: Self-pay

## 2020-07-06 DIAGNOSIS — Z23 Encounter for immunization: Secondary | ICD-10-CM

## 2020-07-06 NOTE — Addendum Note (Signed)
Addended by: Leanne Chang on: 07/06/2020 03:32 PM   Modules accepted: Level of Service

## 2020-07-06 NOTE — Progress Notes (Signed)
° °  Covid-19 Vaccination Clinic  Name:  Sharon Kemp    MRN: [id number] DOB: 10/30/1965  07/06/2020  [name] was observed post Covid-19 immunization for 15 minutes without incident. She was provided with Vaccine Information Sheet and instruction to access the V-Safe system.   [name] was instructed to call 911 with any severe reactions post vaccine:  Difficulty breathing   Swelling of face and throat   A fast heartbeat   A bad rash all over body   Dizziness and weakness   Immunizations Administered    Name Date Dose VIS Date Route   Pfizer COVID-19 Vaccine 07/06/2020  3:00 PM 0.3 mL 05/23/2020 Intramuscular   Manufacturer: [employer], [employer]   Lot: [id number]   NDC: [address]-9

## 2020-08-31 DIAGNOSIS — F422 Mixed obsessional thoughts and acts: Secondary | ICD-10-CM | POA: Diagnosis not present

## 2020-10-05 ENCOUNTER — Other Ambulatory Visit: Payer: Self-pay | Admitting: Obstetrics and Gynecology

## 2020-10-05 DIAGNOSIS — Z1231 Encounter for screening mammogram for malignant neoplasm of breast: Secondary | ICD-10-CM

## 2020-10-16 DIAGNOSIS — Z6822 Body mass index (BMI) 22.0-22.9, adult: Secondary | ICD-10-CM | POA: Diagnosis not present

## 2020-10-16 DIAGNOSIS — Z01419 Encounter for gynecological examination (general) (routine) without abnormal findings: Secondary | ICD-10-CM | POA: Diagnosis not present

## 2020-11-06 DIAGNOSIS — R87610 Atypical squamous cells of undetermined significance on cytologic smear of cervix (ASC-US): Secondary | ICD-10-CM | POA: Diagnosis not present

## 2020-11-06 DIAGNOSIS — N870 Mild cervical dysplasia: Secondary | ICD-10-CM | POA: Diagnosis not present

## 2020-11-28 ENCOUNTER — Other Ambulatory Visit: Payer: Self-pay

## 2020-11-28 ENCOUNTER — Ambulatory Visit
Admission: RE | Admit: 2020-11-28 | Discharge: 2020-11-28 | Disposition: A | Discharge status: Home / Self Care | Payer: BC Managed Care – PPO | Source: Ambulatory Visit | Attending: Obstetrics and Gynecology | Admitting: Obstetrics and Gynecology

## 2020-11-28 DIAGNOSIS — Z1231 Encounter for screening mammogram for malignant neoplasm of breast: Secondary | ICD-10-CM | POA: Diagnosis not present

## 2022-02-19 DIAGNOSIS — M25511 Pain in right shoulder: Secondary | ICD-10-CM | POA: Diagnosis not present

## 2022-03-28 ENCOUNTER — Other Ambulatory Visit: Payer: Self-pay | Admitting: Obstetrics and Gynecology

## 2022-03-28 DIAGNOSIS — Z1231 Encounter for screening mammogram for malignant neoplasm of breast: Secondary | ICD-10-CM

## 2022-04-18 ENCOUNTER — Ambulatory Visit
Admission: RE | Admit: 2022-04-18 | Discharge: 2022-04-18 | Disposition: A | Discharge status: Home / Self Care | Payer: BC Managed Care – PPO | Source: Ambulatory Visit | Attending: Obstetrics and Gynecology | Admitting: Obstetrics and Gynecology

## 2022-04-18 DIAGNOSIS — Z1231 Encounter for screening mammogram for malignant neoplasm of breast: Secondary | ICD-10-CM

## 2022-05-01 DIAGNOSIS — H02409 Unspecified ptosis of unspecified eyelid: Secondary | ICD-10-CM | POA: Diagnosis not present

## 2022-05-01 DIAGNOSIS — Z23 Encounter for immunization: Secondary | ICD-10-CM | POA: Diagnosis not present

## 2022-05-01 DIAGNOSIS — Z7185 Encounter for immunization safety counseling: Secondary | ICD-10-CM | POA: Diagnosis not present

## 2022-05-01 DIAGNOSIS — Z01810 Encounter for preprocedural cardiovascular examination: Secondary | ICD-10-CM | POA: Diagnosis not present
